# Patient Record
Sex: Male | Born: 1960 | Race: White | Hispanic: No | Marital: Married | Smoking: Current some day smoker
Health system: Southern US, Community
[De-identification: ages and names within clinical notes are randomized; demographics above are authoritative.]

## PROBLEM LIST (undated history)

## (undated) HISTORY — PX: WISDOM TOOTH EXTRACTION: SHX21

---
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
## 2002-07-22 ENCOUNTER — Encounter: Payer: Self-pay | Admitting: Internal Medicine

## 2002-07-22 ENCOUNTER — Encounter: Admission: RE | Admit: 2002-07-22 | Discharge: 2002-07-22 | Payer: Self-pay | Admitting: Internal Medicine

## 2002-08-25 ENCOUNTER — Ambulatory Visit (HOSPITAL_COMMUNITY): Admission: RE | Admit: 2002-08-25 | Discharge: 2002-08-25 | Payer: Self-pay | Admitting: Neurosurgery

## 2002-08-25 ENCOUNTER — Encounter: Payer: Self-pay | Admitting: Neurosurgery

## 2005-01-03 ENCOUNTER — Ambulatory Visit: Payer: Self-pay | Admitting: Internal Medicine

## 2005-01-04 ENCOUNTER — Ambulatory Visit: Payer: Self-pay | Admitting: Internal Medicine

## 2005-01-31 ENCOUNTER — Emergency Department (HOSPITAL_COMMUNITY): Admission: EM | Admit: 2005-01-31 | Discharge: 2005-01-31 | Payer: Self-pay | Admitting: Emergency Medicine

## 2006-07-03 ENCOUNTER — Encounter: Admission: RE | Admit: 2006-07-03 | Discharge: 2006-07-03 | Payer: Self-pay | Admitting: Internal Medicine

## 2006-12-14 ENCOUNTER — Encounter: Admission: RE | Admit: 2006-12-14 | Discharge: 2006-12-14 | Payer: Self-pay | Admitting: Internal Medicine

## 2008-12-16 IMAGING — US US ABDOMEN COMPLETE
1 series · 14 of 25 positions shown · non-contrast
Comparison: none

CLINICAL DATA: Liver lesion by CT. 
 ABDOMEN ULTRASOUND:
TECHNIQUE: Complete abdominal ultrasound examination was performed including evaluation of the liver, gallbladder, bile ducts, pancreas, kidneys, spleen, IVC, and abdominal aorta.

[Series 1: us abdomen complete · 0.29mm/px · 14 of 77 slices shown]
[im 1/77]
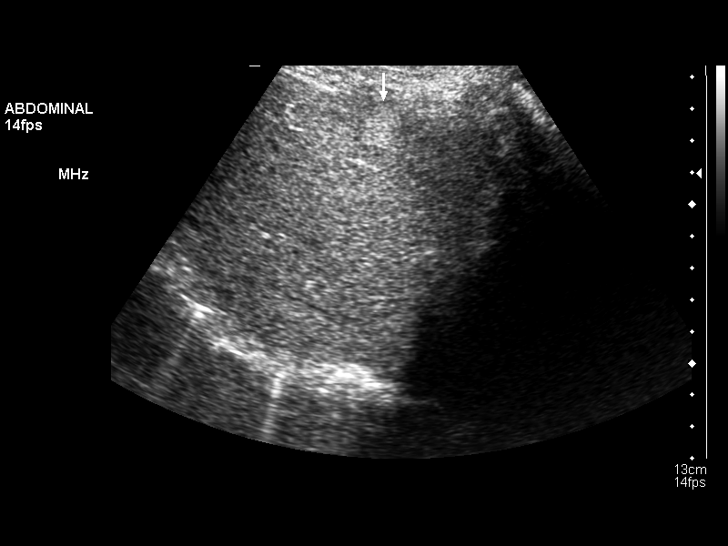
[im 7/77]
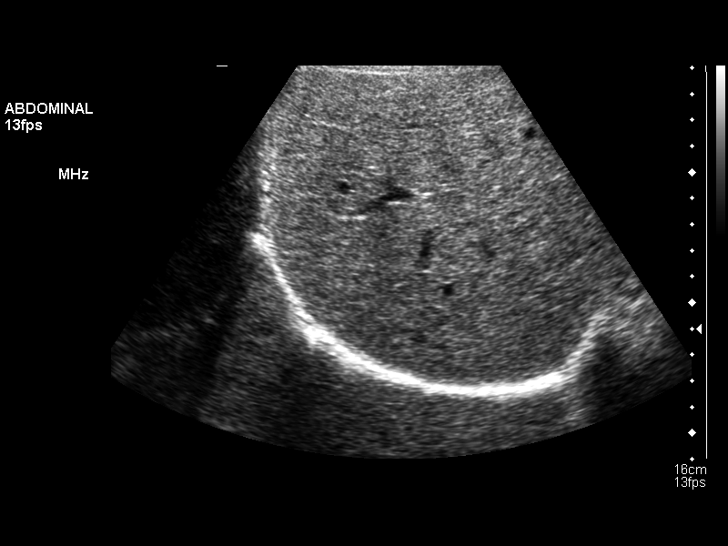
[im 13/77]
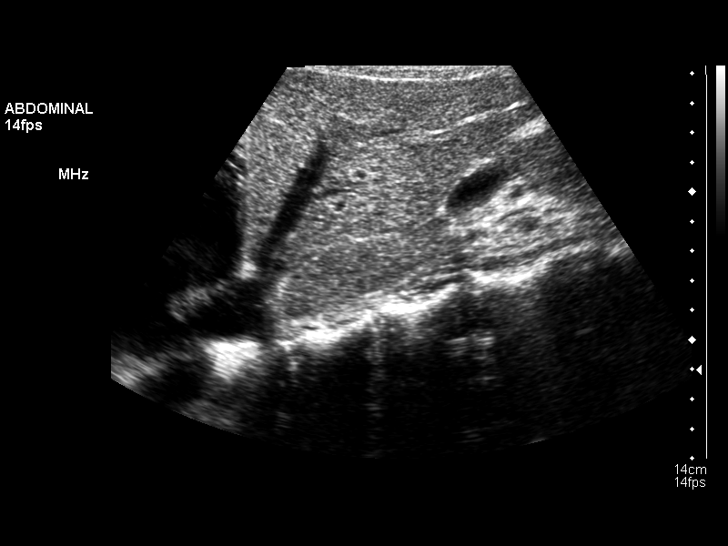
[im 20/77]
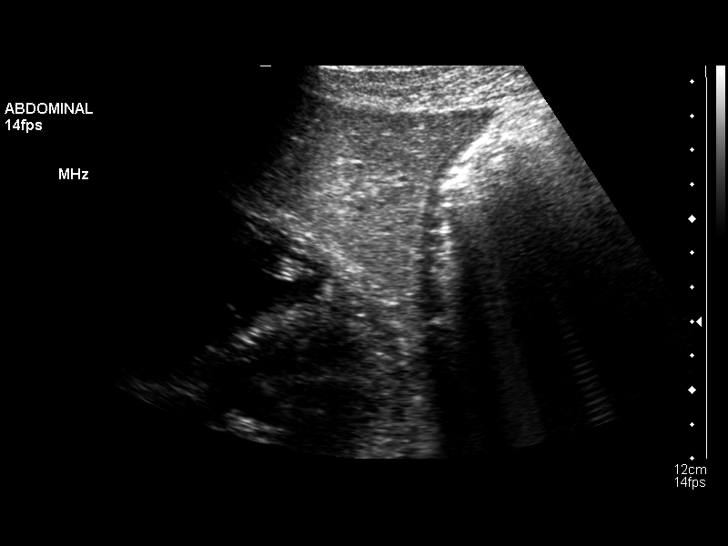
[im 26/77]
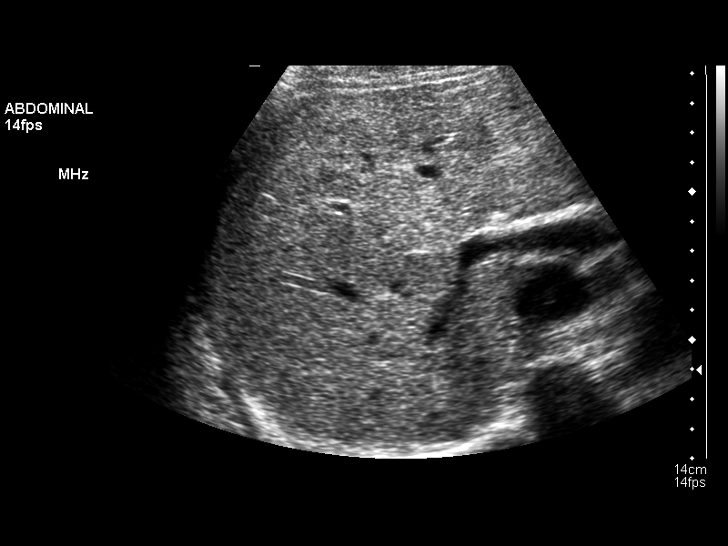
[im 29/77]
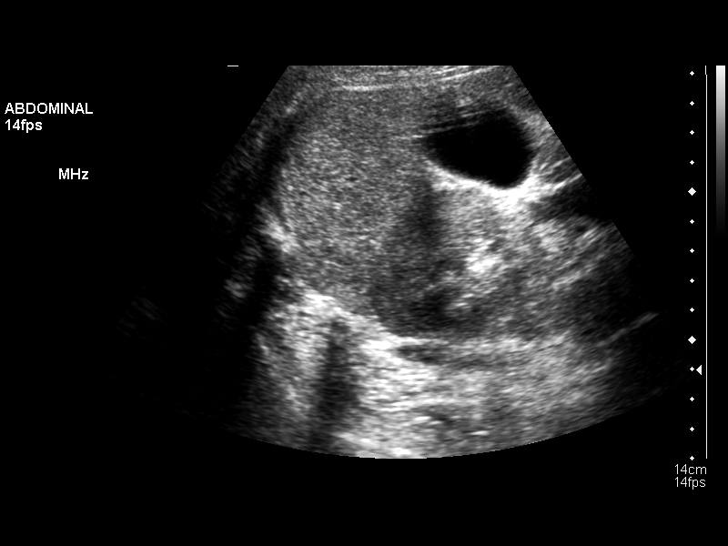
[im 35/77]
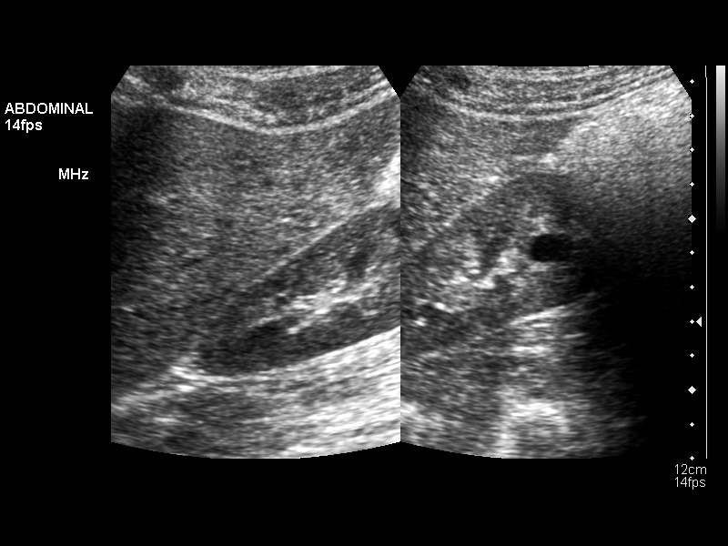
[im 42/77]
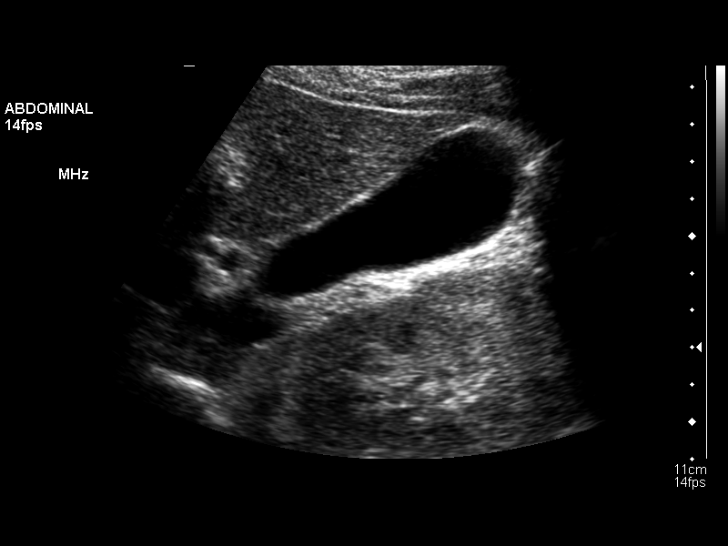
[im 48/77]
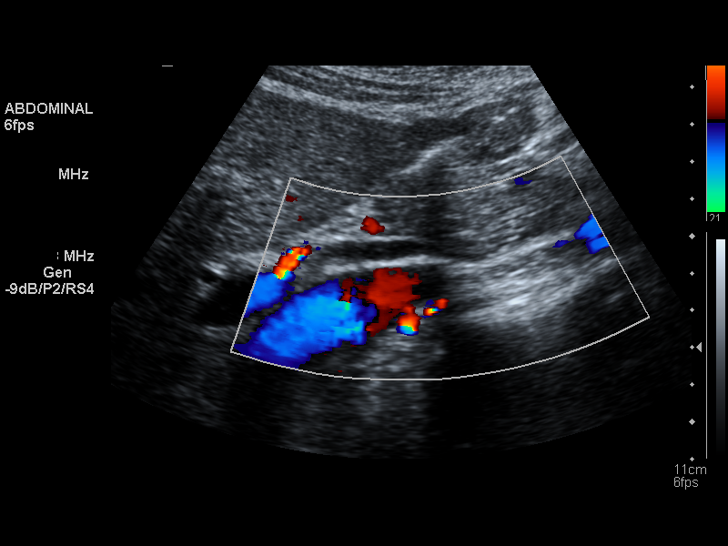
[im 51/77]
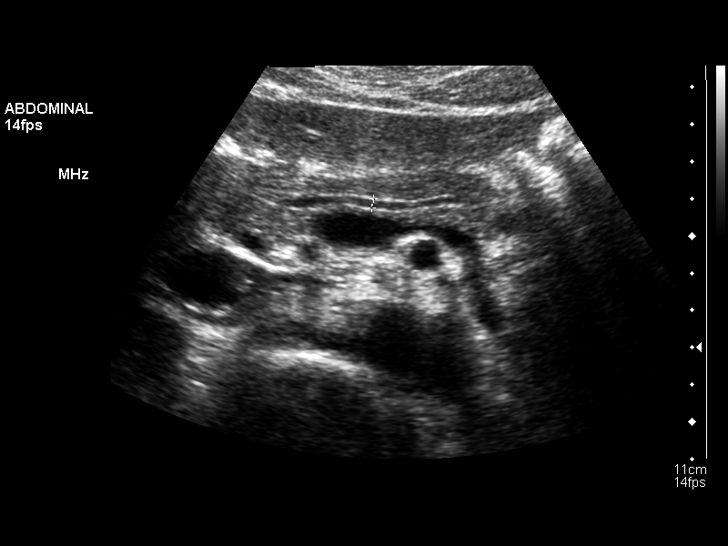
[im 58/77]
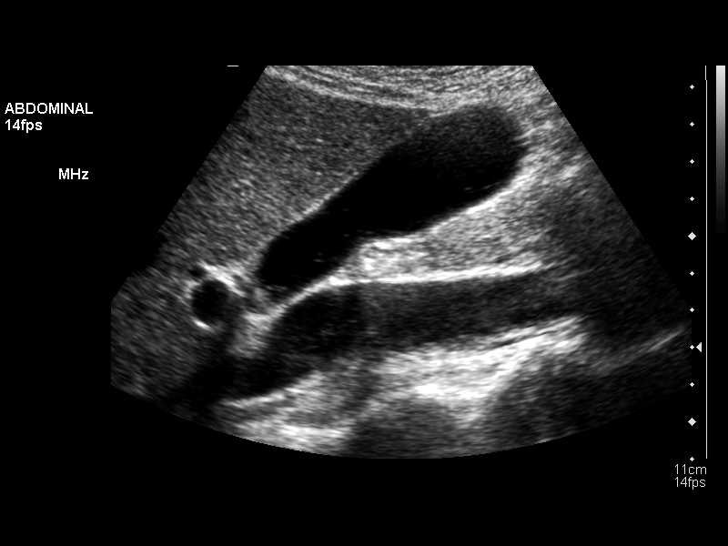
[im 64/77]
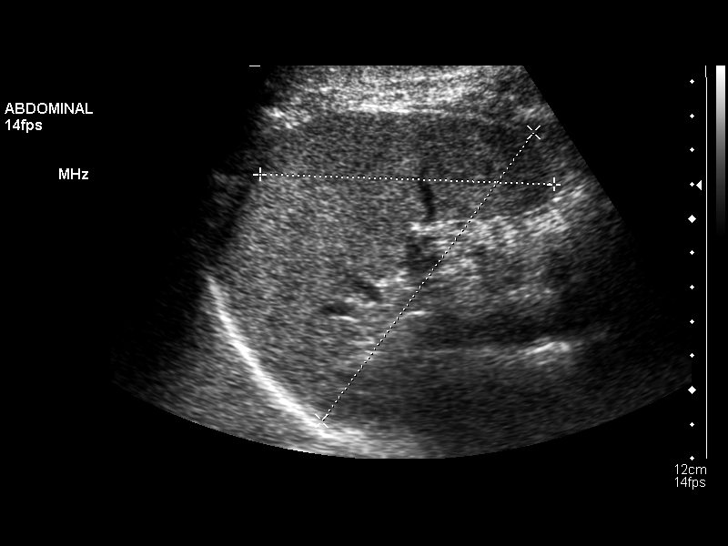
[im 70/77]
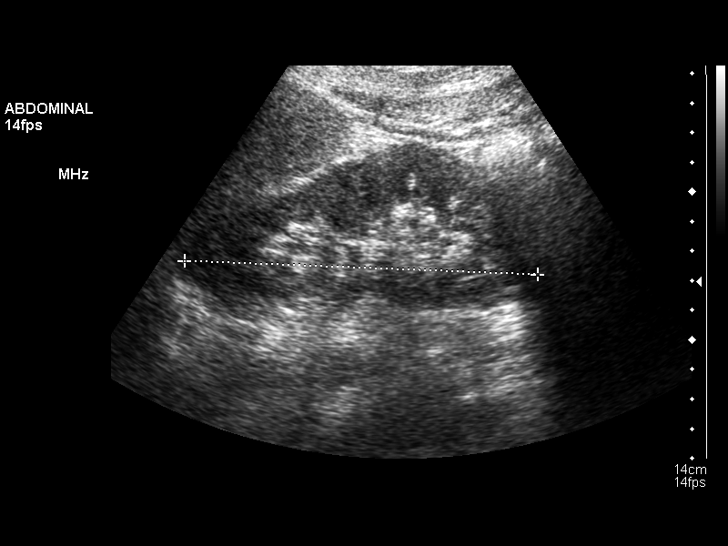
[im 77/77]
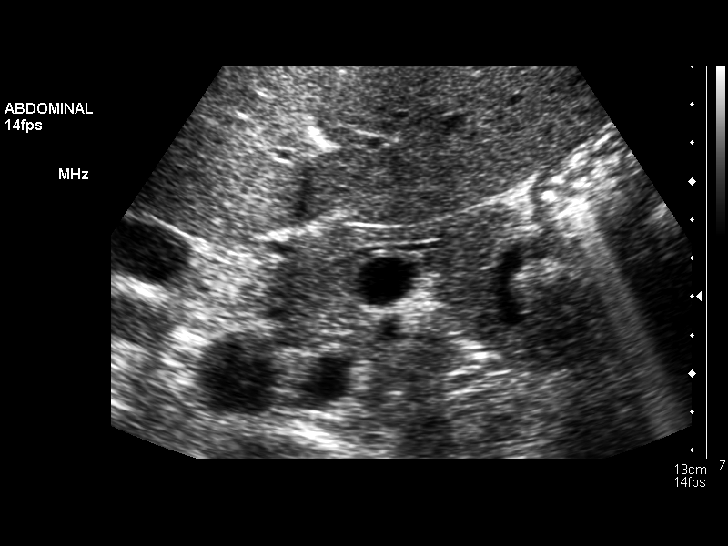

[14 of 25 positions shown; findings below may reference images not displayed]

FINDINGS: The gallbladder is well seen and no gallstones are noted.  The liver has a normal echogenic pattern with the exception of a single focus of increased echogenicity in the periphery of the right lobe corresponding to the low attenuation noted on CT, consistent with a hemangioma of 1.4 x 1.0 x 1.1 cm.  Common bile duct is normal measuring 3.2 mm in diameter.  The IVC, pancreas and spleen appear normal although the pancreatic duct is within upper limits of normal at 2.5 mm in diameter.  No hydronephrosis is seen.  The right kidney measures 10.6 cm sagittally with the left kidney measuring 11.9 cm.  A 1.1 cm cyst is noted in the mid portion of the right kidney.  The abdominal aorta is normal in caliber.
IMPRESSION: 1.  No gallstones. 
 2.  1.4 cm right lobe of liver hemangioma.

## 2013-06-24 ENCOUNTER — Encounter: Payer: Self-pay | Admitting: Gastroenterology

## 2013-07-30 ENCOUNTER — Ambulatory Visit (AMBULATORY_SURGERY_CENTER): Payer: Self-pay | Admitting: *Deleted

## 2013-07-30 VITALS — Ht 72.0 in | Wt 174.8 lb

## 2013-07-30 DIAGNOSIS — Z1211 Encounter for screening for malignant neoplasm of colon: Secondary | ICD-10-CM

## 2013-07-30 MED ORDER — MOVIPREP 100 G PO SOLR: ORAL | Status: DC

## 2013-07-30 NOTE — Progress Notes (Signed)
No allergies to eggs or soy. No problems with anesthesia.  

## 2013-07-31 ENCOUNTER — Encounter: Payer: Self-pay | Admitting: Gastroenterology

## 2013-08-12 ENCOUNTER — Ambulatory Visit (AMBULATORY_SURGERY_CENTER): Payer: BC Managed Care – PPO | Admitting: Gastroenterology

## 2013-08-12 ENCOUNTER — Encounter: Payer: Self-pay | Admitting: Gastroenterology

## 2013-08-12 VITALS — BP 100/71 | HR 74 | Temp 96.3°F | Resp 11 | Ht 72.0 in | Wt 174.0 lb

## 2013-08-12 DIAGNOSIS — Z1211 Encounter for screening for malignant neoplasm of colon: Secondary | ICD-10-CM

## 2013-08-12 DIAGNOSIS — D126 Benign neoplasm of colon, unspecified: Secondary | ICD-10-CM

## 2013-08-12 MED ORDER — SODIUM CHLORIDE 0.9 % IV SOLN: 500.0000 mL | INTRAVENOUS | Status: DC

## 2013-08-12 NOTE — Op Note (Signed)
[facility]  [address] [place], [address]   COLONOSCOPY PROCEDURE REPORT  PATIENT: Darryl Lozano, Darryl Lozano  MR#: [id number] BIRTHDATE: [birth year] , 52  yrs. old GENDER: Male ENDOSCOPIST: Milus Banister, MD REFERRED SH:FWYOVZC Minna Antis, M.D. PROCEDURE DATE:  08/12/2013 PROCEDURE:   Colonoscopy with snare polypectomy First Screening Colonoscopy - Avg.  risk and is 50 yrs.  old or older Yes.  Prior Negative Screening - Now for repeat screening. N/A  History of Adenoma - Now for follow-up colonoscopy & has been > or = to 3 yrs.  N/A  Polyps Removed Today? Yes. ASA CLASS:   Class I INDICATIONS:average risk screening. MEDICATIONS: propofol (Diprivan) 250mg  IV and MAC sedation, administered by CRNA  DESCRIPTION OF PROCEDURE:   After the risks benefits and alternatives of the procedure were thoroughly explained, informed consent was obtained.  A digital rectal exam revealed no abnormalities of the rectum.   The LB [id number]  endoscope was introduced through the anus and advanced to the cecum, which was identified by both the appendix and ileocecal valve. No adverse events experienced.   The quality of the prep was excellent.  The instrument was then slowly withdrawn as the colon was fully examined.   COLON FINDINGS: One polyp was found, removed and sent to pathology. This was sessile, located in ascending segment, 76mm across, removed with cold snare.  The examination was otherwise normal. Retroflexed views revealed no abnormalities. The time to cecum=6 minutes 23 seconds.  Withdrawal time=8 minutes 36 seconds.  The scope was withdrawn and the procedure completed. COMPLICATIONS: There were no complications.  ENDOSCOPIC IMPRESSION: One polyp was found, removed and sent to pathology. The examination was otherwise normal.  RECOMMENDATIONS: If the polyp(s) removed today are proven to be adenomatous (pre-cancerous) polyps, you will need a repeat colonoscopy in  5 years.  Otherwise you should continue to follow colorectal cancer screening guidelines for "routine risk" patients with colonoscopy in 10 years.  You will receive a letter within 1-2 weeks with the results of your biopsy as well as final recommendations.  Please call my office if you have not received a letter after 3 weeks.   eSigned:  Milus Banister, MD 08/12/2013 8:24 AM

## 2013-08-12 NOTE — Progress Notes (Signed)
Called to room to assist during endoscopic procedure.  Patient ID and intended procedure confirmed with present staff. Received instructions for my participation in the procedure from the performing physician.  

## 2013-08-12 NOTE — Patient Instructions (Signed)
YOU HAD AN ENDOSCOPIC PROCEDURE TODAY AT THE [facility]: Refer to the procedure report that was given to you for any specific questions about what was found during the examination.  If the procedure report does not answer your questions, please call your gastroenterologist to clarify.  If you requested that your care partner not be given the details of your procedure findings, then the procedure report has been included in a sealed envelope for you to review at your convenience later.  YOU SHOULD EXPECT: Some feelings of bloating in the abdomen. Passage of more gas than usual.  Walking can help get rid of the air that was put into your GI tract during the procedure and reduce the bloating. If you had a lower endoscopy (such as a colonoscopy or flexible sigmoidoscopy) you may notice spotting of blood in your stool or on the toilet paper. If you underwent a bowel prep for your procedure, then you may not have a normal bowel movement for a few days.  DIET: Your first meal following the procedure should be a light meal and then it is ok to progress to your normal diet.  A half-sandwich or bowl of soup is an example of a good first meal.  Heavy or fried foods are harder to digest and may make you feel nauseous or bloated.  Likewise meals heavy in dairy and vegetables can cause extra gas to form and this can also increase the bloating.  Drink plenty of fluids but you should avoid alcoholic beverages for 24 hours.  ACTIVITY: Your care partner should take you home directly after the procedure.  You should plan to take it easy, moving slowly for the rest of the day.  You can resume normal activity the day after the procedure however you should NOT DRIVE or use heavy machinery for 24 hours (because of the sedation medicines used during the test).    SYMPTOMS TO REPORT IMMEDIATELY: A gastroenterologist can be reached at any hour.  During normal business hours, 8:30 AM to 5:00 PM Monday through Friday,  call [phone].  After hours and on weekends, please call the GI answering service at [phone] who will take a message and have the physician on call contact you.   Following lower endoscopy (colonoscopy or flexible sigmoidoscopy):  Excessive amounts of blood in the stool  Significant tenderness or worsening of abdominal pains  Swelling of the abdomen that is new, acute  Fever of 100F or higher    FOLLOW UP: If any biopsies were taken you will be contacted by phone or by letter within the next 1-3 weeks.  Call your gastroenterologist if you have not heard about the biopsies in 3 weeks.  Our staff will call the home number listed on your records the next business day following your procedure to check on you and address any questions or concerns that you may have at that time regarding the information given to you following your procedure. This is a courtesy call and so if there is no answer at the home number and we have not heard from you through the emergency physician on call, we will assume that you have returned to your regular daily activities without incident.  SIGNATURES/CONFIDENTIALITY: You and/or your care partner have signed paperwork which will be entered into your electronic medical record.  These signatures attest to the fact that that the information above on your After Visit Summary has been reviewed and is understood.  Full responsibility of the confidentiality   of this discharge information lies with you and/or your care-partner.     

## 2013-08-12 NOTE — Progress Notes (Signed)
A/ox3 pleased with MAC, report to Karen RN 

## 2013-08-13 ENCOUNTER — Telehealth: Payer: Self-pay

## 2013-08-13 NOTE — Telephone Encounter (Signed)
Left message on answering machine. 

## 2013-08-20 ENCOUNTER — Encounter: Payer: Self-pay | Admitting: Gastroenterology

## 2020-10-07 ENCOUNTER — Ambulatory Visit: Payer: Self-pay | Admitting: Dermatology

## 2022-01-23 DIAGNOSIS — L723 Sebaceous cyst: Secondary | ICD-10-CM | POA: Diagnosis not present

## 2022-01-23 DIAGNOSIS — R2232 Localized swelling, mass and lump, left upper limb: Secondary | ICD-10-CM | POA: Diagnosis not present

## 2022-01-23 DIAGNOSIS — L918 Other hypertrophic disorders of the skin: Secondary | ICD-10-CM | POA: Diagnosis not present

## 2022-01-23 DIAGNOSIS — L570 Actinic keratosis: Secondary | ICD-10-CM | POA: Diagnosis not present

## 2022-02-20 DIAGNOSIS — Z125 Encounter for screening for malignant neoplasm of prostate: Secondary | ICD-10-CM | POA: Diagnosis not present

## 2022-02-20 DIAGNOSIS — Z0000 Encounter for general adult medical examination without abnormal findings: Secondary | ICD-10-CM | POA: Diagnosis not present

## 2022-02-27 DIAGNOSIS — Z0000 Encounter for general adult medical examination without abnormal findings: Secondary | ICD-10-CM | POA: Diagnosis not present

## 2022-02-27 DIAGNOSIS — Z23 Encounter for immunization: Secondary | ICD-10-CM | POA: Diagnosis not present

## 2022-05-31 DIAGNOSIS — Z23 Encounter for immunization: Secondary | ICD-10-CM | POA: Diagnosis not present

## 2022-08-09 DIAGNOSIS — L820 Inflamed seborrheic keratosis: Secondary | ICD-10-CM | POA: Diagnosis not present

## 2022-08-09 DIAGNOSIS — Z1283 Encounter for screening for malignant neoplasm of skin: Secondary | ICD-10-CM | POA: Diagnosis not present

## 2022-08-09 DIAGNOSIS — D225 Melanocytic nevi of trunk: Secondary | ICD-10-CM | POA: Diagnosis not present

## 2022-08-09 DIAGNOSIS — L821 Other seborrheic keratosis: Secondary | ICD-10-CM | POA: Diagnosis not present

## 2023-03-12 DIAGNOSIS — Z125 Encounter for screening for malignant neoplasm of prostate: Secondary | ICD-10-CM | POA: Diagnosis not present

## 2023-03-12 DIAGNOSIS — E781 Pure hyperglyceridemia: Secondary | ICD-10-CM | POA: Diagnosis not present

## 2023-03-12 DIAGNOSIS — Z0000 Encounter for general adult medical examination without abnormal findings: Secondary | ICD-10-CM | POA: Diagnosis not present

## 2023-03-19 DIAGNOSIS — K644 Residual hemorrhoidal skin tags: Secondary | ICD-10-CM | POA: Diagnosis not present

## 2023-03-19 DIAGNOSIS — R3914 Feeling of incomplete bladder emptying: Secondary | ICD-10-CM | POA: Diagnosis not present

## 2023-03-19 DIAGNOSIS — N401 Enlarged prostate with lower urinary tract symptoms: Secondary | ICD-10-CM | POA: Diagnosis not present

## 2023-03-19 DIAGNOSIS — Z23 Encounter for immunization: Secondary | ICD-10-CM | POA: Diagnosis not present

## 2023-03-19 DIAGNOSIS — Z0000 Encounter for general adult medical examination without abnormal findings: Secondary | ICD-10-CM | POA: Diagnosis not present

## 2023-03-19 DIAGNOSIS — E781 Pure hyperglyceridemia: Secondary | ICD-10-CM | POA: Diagnosis not present

## 2023-06-11 NOTE — Progress Notes (Signed)
 Chief Complaint: bump on rectum Primary GI Doctor: (Previously Dr. Teressa) Dr. Federico  HPI: Patient is a 63 year old male patient, previously known to Dr. Teressa, that presents as a new patient with past medical history of hernia (1972) and benign colonic polyps (2015) for a complaint of hemorrhoids.     Patient had colonoscopy with Dr. Teressa on 08/12/2013 and 1 benign polyp was found, the examination was otherwise normal. Recommendations to follow-up in 10 years. He reports he moved to [place]  for a few years and had colonoscopy in 2023 that per patient was normal.  Interval History:    Patient presents with main complaint of feeling bump outside anus, not sure if hemorrhoids. He has concerns with what it is and having it in his rectal area due to cleanliness. Patient reports no issues with constipation or diarrhea. No straining or sitting on toilet long periods of time. No rectal itching, pain, or bleeding. He would like to have growth removed and comes in for evaluation. No other gastrointestinal complaints at this time.  History reviewed. No pertinent past medical history.  Past Surgical History:  Procedure Laterality Date   INGUINAL HERNIA REPAIR Right 1973   No current outpatient medications on file.   No current facility-administered medications for this visit.   Allergies as of 06/12/2023   (Not on File)   Family History  Problem Relation Age of Onset   Esophageal cancer Maternal Grandmother    Colon cancer Neg Hx    Stomach cancer Neg Hx    Review of Systems:    Constitutional: No weight loss, fever, chills, weakness or fatigue HEENT: Eyes: No change in vision               Ears, Nose, Throat:  No change in hearing or congestion Skin: No rash or itching Cardiovascular: No chest pain, chest pressure or palpitations   Respiratory: No SOB or cough Gastrointestinal: See HPI and otherwise negative Genitourinary: No dysuria or change in urinary  frequency Neurological: No headache, dizziness or syncope Musculoskeletal: No new muscle or joint pain Hematologic: No bleeding or bruising Psychiatric: No history of depression or anxiety   Physical Exam:  Vital signs: BP 110/70   Pulse 65   Ht 6' (1.829 m)   Wt 169 lb (76.7 kg)   BMI 22.92 kg/m   Constitutional:  Pleasant Caucasian male appears to be in NAD, Well developed, Well nourished, alert and cooperative Respiratory: Respirations even and unlabored. Lungs clear to auscultation bilaterally.   No wheezes, crackles, or rhonchi.  Cardiovascular: Normal S1, S2. No MRG. Regular rate and rhythm. No peripheral edema, cyanosis or pallor.  Gastrointestinal:  Soft, nondistended, nontender. No rebound or guarding. Normal bowel sounds. No appreciable masses or hepatomegaly. Rectal: external rectal exam with small skin tag and dime sized lesion that is hard and no discharge or pain with pressure,noted white head nodules around rectum, normal rectal tone, no internal hemorrhoids appreciated, no masses, non tender, brown stool Skin:   Dry and intact without significant lesions or rashes. Psychiatric: Oriented to person, place and time. Demonstrates good judgement and reason without abnormal affect or behaviors.  RELEVANT LABS AND IMAGING: NO LABS  08/12/2013 colonoscopy with Dr. Teressa Impression 1 polyp was found, removed and sent to pathology The examination was otherwise normal.  Recommendations to follow-up in 10 years Path: Diagnosis Surgical [P], ascending, polyp - BENIGN LYMPHOID POLYP. - NO ADENOMATOUS CHANGE OR MALIGNANCY IDENTIFIED  2023 [facility]oscopy (in [place]  [facility] per patient normal.   Encounter Diagnoses  Name Primary?   Anal lesion Yes   History of colonic polyps   63 year old year old male patient that presents with main concern of external growth on rectum. He denies any rectal pain, itching, or bleeding but requests it be  removed. DRE exam normal otherwise. UTD on colon screening. Will refer for surgical consult.  Plan: -Request records of colonoscopy from 2023 in [place]  for records, pending recall on results -Refer to [facility] for consultation of external rectal growth/tags  Darryl Ruggerio, [name] 06/12/2023, 3:21 PM  Cc: Darryl Ade, MD

## 2023-06-12 ENCOUNTER — Ambulatory Visit: Payer: Self-pay | Admitting: Gastroenterology

## 2023-06-12 ENCOUNTER — Encounter: Payer: Self-pay | Admitting: Gastroenterology

## 2023-06-12 VITALS — BP 110/70 | HR 65 | Ht 72.0 in | Wt 169.0 lb

## 2023-06-12 DIAGNOSIS — K629 Disease of anus and rectum, unspecified: Secondary | ICD-10-CM

## 2023-06-12 DIAGNOSIS — Z860100 Personal history of colon polyps, unspecified: Secondary | ICD-10-CM

## 2023-06-12 NOTE — Progress Notes (Signed)
 I agree with the assessment and plan as outlined by Ms. May.

## 2023-06-12 NOTE — Patient Instructions (Signed)
 We will refer you to [facility] and they will contact you with an appointment once records are reviewed   _______________________________________________________  If your blood pressure at your visit was 140/90 or greater, please contact your primary care physician to follow up on this.  _______________________________________________________  If you are age 63 or older, your body mass index should be between 23-30. Your Body mass index is 22.92 kg/m. If this is out of the aforementioned range listed, please consider follow up with your Primary Care Provider.  If you are age 35 or younger, your body mass index should be between 19-25. Your Body mass index is 22.92 kg/m. If this is out of the aformentioned range listed, please consider follow up with your Primary Care Provider.   ________________________________________________________  The [facility] providers would like to encourage you to use MYCHART to communicate with providers for non-urgent requests or questions.  Due to long hold times on the telephone, sending your provider a message by [facility] may be a faster and more efficient way to get a response.  Please allow 48 business hours for a response.  Please remember that this is for non-urgent requests.  _______________________________________________________   I appreciate the  opportunity to care for you  Thank You   Deanna May,NP

## 2023-06-20 DIAGNOSIS — L728 Other follicular cysts of the skin and subcutaneous tissue: Secondary | ICD-10-CM | POA: Diagnosis not present

## 2023-06-20 DIAGNOSIS — L821 Other seborrheic keratosis: Secondary | ICD-10-CM | POA: Diagnosis not present

## 2023-06-20 DIAGNOSIS — L308 Other specified dermatitis: Secondary | ICD-10-CM | POA: Diagnosis not present

## 2023-06-20 DIAGNOSIS — Z1283 Encounter for screening for malignant neoplasm of skin: Secondary | ICD-10-CM | POA: Diagnosis not present

## 2023-06-26 ENCOUNTER — Telehealth: Payer: Self-pay | Admitting: Gastroenterology

## 2023-06-26 NOTE — Telephone Encounter (Signed)
Patient called and stated that he was suppose to get a referral sent over to [facility]. Patient is requesting a call back. Please advise.

## 2023-06-26 NOTE — Telephone Encounter (Signed)
Referral was submitted on 06/10/2023 , Referral in system as ready for scheduling with Romie Levee I will call [facility] to see how much longer it will be  Dx  Anal lesion  I called [facility] and they pulled his referral and will call him to schedule.   I called the patient to inform

## 2023-07-30 ENCOUNTER — Ambulatory Visit: Payer: Self-pay | Admitting: General Surgery

## 2023-07-30 DIAGNOSIS — L700 Acne vulgaris: Secondary | ICD-10-CM | POA: Diagnosis not present

## 2023-07-30 DIAGNOSIS — A630 Anogenital (venereal) warts: Secondary | ICD-10-CM | POA: Diagnosis not present

## 2023-07-30 NOTE — H&P (View-Only) (Signed)
   REFERRING PHYSICIAN:  May, Deanna J  PROVIDER:  Elenora Gamma, MD  MRN: [id number] DOB: 1961/01/22 DATE OF ENCOUNTER: 07/30/2023  Subjective   Chief Complaint: No chief complaint on file.     History of Present Illness: Darryl Lozano is a 63 y.o. male who is seen today as an office consultation at the request of Dr. May for evaluation of No chief complaint on file. .  Pt with external perianal growth.  He was sent here for further examination.     Review of Systems: A complete review of systems was obtained from the patient.  I have reviewed this information and discussed as appropriate with the patient.  See HPI as well for other ROS.     Medical History: History reviewed. No pertinent past medical history.  There is no problem list on file for this patient.   Past Surgical History:  Procedure Laterality Date   HERNIA REPAIR       No Known Allergies  No current outpatient medications on file prior to visit.   No current facility-administered medications on file prior to visit.    Family History  Problem Relation Age of Onset   Stroke Father    Coronary Artery Disease (Blocked arteries around heart) Father    Breast cancer Sister      Social History   Tobacco Use  Smoking Status Never  Smokeless Tobacco Never     Social History   Socioeconomic History   Marital status: Married  Tobacco Use   Smoking status: Never   Smokeless tobacco: Never  Vaping Use   Vaping status: Never Used  Substance and Sexual Activity   Alcohol use: Yes   Drug use: Never   Social Drivers of Health   Housing Stability: Unknown (07/30/2023)   Housing Stability Vital Sign    Homeless in the Last Year: No    Objective:    Vitals:   07/30/23 1416  BP: 114/62  Pulse: 60  Temp: 36.7 C (98 F)  SpO2: 98%  Weight: 76.3 kg (168 lb 3.2 oz)  Height: 182.9 cm (6')  PainSc: 0-No pain     Exam Gen: NAD Abd: soft Rectal: comedomes of the perianal area.   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  I recommended an EUA with laser ablation and internal examination Closed comedone These are benign.  Will take care of these while in the OR    Vanita Panda, MD Colon and Rectal Surgery [facility]

## 2023-07-30 NOTE — H&P (Signed)
   REFERRING PHYSICIAN:  May, Deanna J  PROVIDER:  Elenora Gamma, MD  MRN: [id number] DOB: 1961/01/22 DATE OF ENCOUNTER: 07/30/2023  Subjective   Chief Complaint: No chief complaint on file.     History of Present Illness: Darryl Lozano is a 63 y.o. male who is seen today as an office consultation at the request of Dr. May for evaluation of No chief complaint on file. .  Pt with external perianal growth.  He was sent here for further examination.     Review of Systems: A complete review of systems was obtained from the patient.  I have reviewed this information and discussed as appropriate with the patient.  See HPI as well for other ROS.     Medical History: History reviewed. No pertinent past medical history.  There is no problem list on file for this patient.   Past Surgical History:  Procedure Laterality Date   HERNIA REPAIR       No Known Allergies  No current outpatient medications on file prior to visit.   No current facility-administered medications on file prior to visit.    Family History  Problem Relation Age of Onset   Stroke Father    Coronary Artery Disease (Blocked arteries around heart) Father    Breast cancer Sister      Social History   Tobacco Use  Smoking Status Never  Smokeless Tobacco Never     Social History   Socioeconomic History   Marital status: Married  Tobacco Use   Smoking status: Never   Smokeless tobacco: Never  Vaping Use   Vaping status: Never Used  Substance and Sexual Activity   Alcohol use: Yes   Drug use: Never   Social Drivers of Health   Housing Stability: Unknown (07/30/2023)   Housing Stability Vital Sign    Homeless in the Last Year: No    Objective:    Vitals:   07/30/23 1416  BP: 114/62  Pulse: 60  Temp: 36.7 C (98 F)  SpO2: 98%  Weight: 76.3 kg (168 lb 3.2 oz)  Height: 182.9 cm (6')  PainSc: 0-No pain     Exam Gen: NAD Abd: soft Rectal: comedomes of the perianal area.   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  I recommended an EUA with laser ablation and internal examination Closed comedone These are benign.  Will take care of these while in the OR    Vanita Panda, MD Colon and Rectal Surgery [facility]

## 2023-08-06 NOTE — Progress Notes (Signed)
 COVID Vaccine Completed:  Date of COVID positive in last 90 days:  PCP - Georgianne Fick, MD Cardiologist -   Chest x-ray -  EKG -  Stress Test -  ECHO -  Cardiac Cath -  Pacemaker/ICD device last checked: Spinal Cord Stimulator:  Bowel Prep -   Sleep Study -  CPAP -   Fasting Blood Sugar -  Checks Blood Sugar _____ times a day  Last dose of GLP1 agonist-  N/A GLP1 instructions:  Hold 7 days before surgery    Last dose of SGLT-2 inhibitors-  N/A SGLT-2 instructions:  Hold 3 days before surgery    Blood Thinner Instructions:  Last dose:   Time: Aspirin Instructions: Last Dose:  Activity level:  Can go up a flight of stairs and perform activities of daily living without stopping and without symptoms of chest pain or shortness of breath.  Able to exercise without symptoms  Unable to go up a flight of stairs without symptoms of     Anesthesia review:   Patient denies shortness of breath, fever, cough and chest pain at PAT appointment  Patient verbalized understanding of instructions that were given to them at the PAT appointment. Patient was also instructed that they will need to review over the PAT instructions again at home before surgery.

## 2023-08-06 NOTE — Patient Instructions (Signed)
 SURGICAL WAITING ROOM VISITATION Patients having surgery or a procedure may have no more than 2 support people in the waiting area - these visitors may rotate.    Children under the age of 49 must have an adult with them who is not the patient.  Due to an increase in RSV and influenza rates and associated hospitalizations, children ages 63 and under may not visit patients in [facility] hospitals.   If the patient needs to stay at the hospital during part of their recovery, the visitor guidelines for inpatient rooms apply. Pre-op nurse will coordinate an appropriate time for 1 support person to accompany patient in pre-op.  This support person may not rotate.    Please refer to the [facility] website for the visitor guidelines for Inpatients (after your surgery is over and you are in a regular room).       Your procedure is scheduled on: 08-08-23   Report to [facility] Main Entrance    Report to admitting at 7:45 AM   Call this number if you have problems the morning of surgery [phone]   Do not eat food or drink liquids :After Midnight.          If you have questions, please contact your surgeon's office.   FOLLOW BOWEL PREP AND ANY ADDITIONAL PRE OP INSTRUCTIONS YOU RECEIVED FROM YOUR SURGEON'S OFFICE!!!     Oral Hygiene is also important to reduce your risk of infection.                                    Remember - BRUSH YOUR TEETH THE MORNING OF SURGERY WITH YOUR REGULAR TOOTHPASTE   Do NOT smoke after Midnight   Take these medicines the morning of surgery with A SIP OF WATER:    None  Stop all vitamins and herbal supplements 7 days before surgery  Bring CPAP mask and tubing day of surgery.                              You may not have any metal on your body including  jewelry, and body piercing             Do not wear  lotions, powders, cologne, or deodorant              Men may shave face and neck.   Do not bring valuables to the hospital. [facility] IS NOT RESPONSIBLE   FOR VALUABLES.   Contacts, dentures or bridgework may not be worn into surgery.  DO NOT BRING YOUR HOME MEDICATIONS TO THE HOSPITAL. PHARMACY WILL DISPENSE MEDICATIONS LISTED ON YOUR MEDICATION LIST TO YOU DURING YOUR ADMISSION IN THE HOSPITAL!    Patients discharged on the day of surgery will not be allowed to drive home.  Someone NEEDS to stay with you for the first 24 hours after anesthesia.   Special Instructions: Bring a copy of your healthcare power of attorney and living will documents the day of surgery if you haven't scanned them before.              Please read over the following fact sheets you were given: IF YOU HAVE QUESTIONS ABOUT YOUR PRE-OP INSTRUCTIONS PLEASE CALL [phone]   If you received a COVID test during your pre-op visit  it is requested that you wear a mask when  out in public, stay away from anyone that may not be feeling well and notify your surgeon if you develop symptoms. If you test positive for Covid or have been in contact with anyone that has tested positive in the last 10 days please notify you surgeon.  [place] - Preparing for Surgery Before surgery, you can play an important role.  Because skin is not sterile, your skin needs to be as free of germs as possible.  You can reduce the number of germs on your skin by washing with CHG (chlorahexidine gluconate) soap before surgery.  CHG is an antiseptic cleaner which kills germs and bonds with the skin to continue killing germs even after washing. Please DO NOT use if you have an allergy to CHG or antibacterial soaps.  If your skin becomes reddened/irritated stop using the CHG and inform your nurse when you arrive at Short Stay. Do not shave (including legs and underarms) for at least 48 hours prior to the first CHG shower.  You may shave your face/neck.  Please follow these instructions carefully:  1.  Shower with CHG Soap the night before surgery and the  morning of surgery.  2.   If you choose to wash your hair, wash your hair first as usual with your normal  shampoo.  3.  After you shampoo, rinse your hair and body thoroughly to remove the shampoo.                             4.  Use CHG as you would any other liquid soap.  You can apply chg directly to the skin and wash.  Gently with a scrungie or clean washcloth.  5.  Apply the CHG Soap to your body ONLY FROM THE NECK DOWN.   Do   not use on face/ open                           Wound or open sores. Avoid contact with eyes, ears mouth and   genitals (private parts).                       Wash face,  Genitals (private parts) with your normal soap.             6.  Wash thoroughly, paying special attention to the area where your    surgery  will be performed.  7.  Thoroughly rinse your body with warm water from the neck down.  8.  DO NOT shower/wash with your normal soap after using and rinsing off the CHG Soap.                9.  Pat yourself dry with a clean towel.            10.  Wear clean pajamas.            11.  Place clean sheets on your bed the night of your first shower and do not  sleep with pets. Day of Surgery : Do not apply any lotions/deodorants the morning of surgery.  Please wear clean clothes to the hospital/surgery center.  FAILURE TO FOLLOW THESE INSTRUCTIONS MAY RESULT IN THE CANCELLATION OF YOUR SURGERY  PATIENT SIGNATURE_________________________________  NURSE SIGNATURE__________________________________  ________________________________________________________________________

## 2023-08-07 ENCOUNTER — Encounter (HOSPITAL_COMMUNITY): Payer: Self-pay

## 2023-08-07 ENCOUNTER — Encounter (HOSPITAL_COMMUNITY)
Admission: RE | Admit: 2023-08-07 | Discharge: 2023-08-07 | Disposition: A | Discharge status: Home / Self Care | Payer: Self-pay | Source: Ambulatory Visit | Attending: General Surgery | Admitting: General Surgery

## 2023-08-07 ENCOUNTER — Other Ambulatory Visit: Payer: Self-pay

## 2023-08-07 NOTE — Anesthesia Preprocedure Evaluation (Signed)
 Anesthesia Evaluation  Patient identified by MRN, date of birth, ID band Patient awake    Reviewed: Allergy & Precautions, NPO status , Patient's Chart, lab work & pertinent test results  History of Anesthesia Complications Negative for: history of anesthetic complications  Airway Mallampati: I  TM Distance: >3 FB Neck ROM: Full    Dental  (+) Dental Advisory Given   Pulmonary neg shortness of breath, neg sleep apnea, neg COPD, neg recent URI, Current Smoker (patient denies)   Pulmonary exam normal breath sounds clear to auscultation       Cardiovascular negative cardio ROS  Rhythm:Regular Rate:Normal     Neuro/Psych negative neurological ROS     GI/Hepatic Neg liver ROS,neg GERD  ,,Anal warts   Endo/Other    Renal/GU negative Renal ROS     Musculoskeletal   Abdominal   Peds  Hematology negative hematology ROS (+)   Anesthesia Other Findings   Reproductive/Obstetrics                             Anesthesia Physical Anesthesia Plan  ASA: 2  Anesthesia Plan: MAC   Post-op Pain Management: Tylenol PO (pre-op)*   Induction: Intravenous  PONV Risk Score and Plan: 1 and Propofol infusion, TIVA, Treatment may vary due to age or medical condition and Ondansetron  Airway Management Planned: Natural Airway and Simple Face Mask  Additional Equipment:   Intra-op Plan:   Post-operative Plan:   Informed Consent: I have reviewed the patients History and Physical, chart, labs and discussed the procedure including the risks, benefits and alternatives for the proposed anesthesia with the patient or authorized representative who has indicated his/her understanding and acceptance.       Plan Discussed with: CRNA and Anesthesiologist  Anesthesia Plan Comments: (Discussed with patient risks of MAC including, but not limited to, minor pain or discomfort, hearing people in the room, and  possible need for backup general anesthesia. Risks for general anesthesia also discussed including, but not limited to, sore throat, hoarse voice, chipped/damaged teeth, injury to vocal cords, nausea and vomiting, allergic reactions, lung infection, heart attack, stroke, and death. All questions answered. )        Anesthesia Quick Evaluation

## 2023-08-08 ENCOUNTER — Encounter (HOSPITAL_COMMUNITY)
Admission: RE | Disposition: A | Discharge status: Home / Self Care | Payer: Self-pay | Source: Home / Self Care | Attending: General Surgery

## 2023-08-08 ENCOUNTER — Encounter (HOSPITAL_COMMUNITY): Payer: Self-pay | Admitting: General Surgery

## 2023-08-08 ENCOUNTER — Ambulatory Visit (HOSPITAL_COMMUNITY)
Admission: RE | Admit: 2023-08-08 | Discharge: 2023-08-08 | Disposition: A | Discharge status: Home / Self Care | Payer: Self-pay | Attending: General Surgery | Admitting: General Surgery

## 2023-08-08 ENCOUNTER — Other Ambulatory Visit: Payer: Self-pay

## 2023-08-08 ENCOUNTER — Ambulatory Visit (HOSPITAL_COMMUNITY): Payer: Self-pay | Admitting: Anesthesiology

## 2023-08-08 DIAGNOSIS — F17200 Nicotine dependence, unspecified, uncomplicated: Secondary | ICD-10-CM | POA: Insufficient documentation

## 2023-08-08 DIAGNOSIS — A630 Anogenital (venereal) warts: Secondary | ICD-10-CM | POA: Diagnosis not present

## 2023-08-08 DIAGNOSIS — L700 Acne vulgaris: Secondary | ICD-10-CM | POA: Diagnosis not present

## 2023-08-08 DIAGNOSIS — L918 Other hypertrophic disorders of the skin: Secondary | ICD-10-CM | POA: Diagnosis not present

## 2023-08-08 HISTORY — PX: LASER ABLATION CONDOLAMATA: SHX5941

## 2023-08-08 HISTORY — PX: EXCISION OF SKIN TAG: SHX6270

## 2023-08-08 SURGERY — ABLATION, CONDYLOMA, USING LASER
Anesthesia: Monitor Anesthesia Care

## 2023-08-08 MED ORDER — OXYCODONE HCL 5 MG PO TABS: 5.0000 mg | ORAL_TABLET | ORAL | Status: DC | PRN

## 2023-08-08 MED ORDER — CHLORHEXIDINE GLUCONATE 0.12 % MT SOLN
15.0000 mL | Freq: Once | OROMUCOSAL | Status: AC
  Administered 2023-08-08: 15 mL via OROMUCOSAL

## 2023-08-08 MED ORDER — LIDOCAINE (ANORECTAL) 5 % EX CREA: 1.0000 | TOPICAL_CREAM | Freq: Three times a day (TID) | CUTANEOUS | 1 refills | Status: DC | PRN

## 2023-08-08 MED ORDER — TRAMADOL HCL 50 MG PO TABS: 50.0000 mg | ORAL_TABLET | Freq: Four times a day (QID) | ORAL | 0 refills | Status: DC | PRN

## 2023-08-08 MED ORDER — BUPIVACAINE-EPINEPHRINE (PF) 0.25% -1:200000 IJ SOLN
INTRAMUSCULAR | Status: AC
  Filled 2023-08-08: qty 30

## 2023-08-08 MED ORDER — ACETAMINOPHEN 650 MG RE SUPP: 650.0000 mg | RECTAL | Status: DC | PRN

## 2023-08-08 MED ORDER — ORAL CARE MOUTH RINSE: 15.0000 mL | Freq: Once | OROMUCOSAL | Status: AC

## 2023-08-08 MED ORDER — 0.9 % SODIUM CHLORIDE (POUR BTL) OPTIME
TOPICAL | Status: DC | PRN
  Administered 2023-08-08: 1000 mL

## 2023-08-08 MED ORDER — MIDAZOLAM HCL 2 MG/2ML IJ SOLN
INTRAMUSCULAR | Status: AC
  Filled 2023-08-08: qty 2

## 2023-08-08 MED ORDER — SILVER SULFADIAZINE 1 % EX CREA
1.0000 | TOPICAL_CREAM | Freq: Once | CUTANEOUS | Status: DC
  Filled 2023-08-08: qty 50

## 2023-08-08 MED ORDER — DEXAMETHASONE SODIUM PHOSPHATE 10 MG/ML IJ SOLN
INTRAMUSCULAR | Status: DC | PRN
Start: 2023-08-08 — End: 2023-08-08
  Administered 2023-08-08: 10 mg via INTRAVENOUS

## 2023-08-08 MED ORDER — FENTANYL CITRATE (PF) 100 MCG/2ML IJ SOLN
INTRAMUSCULAR | Status: DC | PRN
  Administered 2023-08-08 (×4): 25 ug via INTRAVENOUS

## 2023-08-08 MED ORDER — FENTANYL CITRATE (PF) 100 MCG/2ML IJ SOLN
INTRAMUSCULAR | Status: AC
  Filled 2023-08-08: qty 2

## 2023-08-08 MED ORDER — BUPIVACAINE LIPOSOME 1.3 % IJ SUSP
INTRAMUSCULAR | Status: AC
  Filled 2023-08-08: qty 20

## 2023-08-08 MED ORDER — DEXMEDETOMIDINE HCL IN NACL 80 MCG/20ML IV SOLN
INTRAVENOUS | Status: DC | PRN
  Administered 2023-08-08 (×4): 4 ug via INTRAVENOUS

## 2023-08-08 MED ORDER — DEXAMETHASONE SODIUM PHOSPHATE 10 MG/ML IJ SOLN
INTRAMUSCULAR | Status: AC
  Filled 2023-08-08: qty 1

## 2023-08-08 MED ORDER — MIDAZOLAM HCL 2 MG/2ML IJ SOLN
INTRAMUSCULAR | Status: DC | PRN
  Administered 2023-08-08: 1 mg via INTRAVENOUS

## 2023-08-08 MED ORDER — EPHEDRINE 5 MG/ML INJ
INTRAVENOUS | Status: AC
  Filled 2023-08-08: qty 5

## 2023-08-08 MED ORDER — STERILE WATER FOR IRRIGATION IR SOLN
Status: DC | PRN
  Administered 2023-08-08: 1000 mL

## 2023-08-08 MED ORDER — ACETIC ACID 5 % SOLN
Status: AC
  Filled 2023-08-08: qty 50

## 2023-08-08 MED ORDER — PROPOFOL 10 MG/ML IV BOLUS
INTRAVENOUS | Status: DC | PRN
  Administered 2023-08-08: 20 mg via INTRAVENOUS
  Administered 2023-08-08: 50 ug/kg/min via INTRAVENOUS
  Administered 2023-08-08: 40 mg via INTRAVENOUS
  Administered 2023-08-08 (×2): 20 mg via INTRAVENOUS

## 2023-08-08 MED ORDER — SODIUM CHLORIDE 0.9% FLUSH: 3.0000 mL | INTRAVENOUS | Status: DC | PRN

## 2023-08-08 MED ORDER — EPHEDRINE SULFATE-NACL 50-0.9 MG/10ML-% IV SOSY
PREFILLED_SYRINGE | INTRAVENOUS | Status: DC | PRN
  Administered 2023-08-08 (×2): 5 mg via INTRAVENOUS

## 2023-08-08 MED ORDER — SODIUM CHLORIDE 0.9 % IV SOLN: 250.0000 mL | INTRAVENOUS | Status: DC | PRN

## 2023-08-08 MED ORDER — ACETAMINOPHEN 500 MG PO TABS
1000.0000 mg | ORAL_TABLET | ORAL | Status: AC
  Administered 2023-08-08: 1000 mg via ORAL
  Filled 2023-08-08: qty 2

## 2023-08-08 MED ORDER — DEXMEDETOMIDINE HCL IN NACL 80 MCG/20ML IV SOLN
INTRAVENOUS | Status: AC
  Filled 2023-08-08: qty 20

## 2023-08-08 MED ORDER — LACTATED RINGERS IV SOLN: INTRAVENOUS | Status: DC

## 2023-08-08 MED ORDER — ONDANSETRON HCL 4 MG/2ML IJ SOLN
INTRAMUSCULAR | Status: DC | PRN
Start: 2023-08-08 — End: 2023-08-08
  Administered 2023-08-08: 4 mg via INTRAVENOUS

## 2023-08-08 MED ORDER — GLYCOPYRROLATE 0.2 MG/ML IJ SOLN
INTRAMUSCULAR | Status: DC | PRN
  Administered 2023-08-08 (×2): .1 mg via INTRAVENOUS

## 2023-08-08 MED ORDER — PROPOFOL 1000 MG/100ML IV EMUL
INTRAVENOUS | Status: AC
  Filled 2023-08-08: qty 100

## 2023-08-08 MED ORDER — ACETAMINOPHEN 325 MG PO TABS: 650.0000 mg | ORAL_TABLET | ORAL | Status: DC | PRN

## 2023-08-08 MED ORDER — ONDANSETRON HCL 4 MG/2ML IJ SOLN
INTRAMUSCULAR | Status: AC
  Filled 2023-08-08: qty 2

## 2023-08-08 MED ORDER — BUPIVACAINE-EPINEPHRINE (PF) 0.25% -1:200000 IJ SOLN
INTRAMUSCULAR | Status: DC | PRN
Start: 2023-08-08 — End: 2023-08-08
  Administered 2023-08-08: 30 mL via PERINEURAL

## 2023-08-08 MED ORDER — SODIUM CHLORIDE 0.9% FLUSH: 3.0000 mL | Freq: Two times a day (BID) | INTRAVENOUS | Status: DC

## 2023-08-08 MED ORDER — SILVER NITRATE-POT NITRATE 75-25 % EX MISC
CUTANEOUS | Status: AC
  Filled 2023-08-08: qty 10

## 2023-08-08 SURGICAL SUPPLY — 39 items
BAG COUNTER SPONGE SURGICOUNT (BAG) IMPLANT
BLADE HEX COATED 2.75 (ELECTRODE) ×2 IMPLANT
BLADE SURG 15 STRL LF DISP TIS (BLADE) ×2 IMPLANT
BRIEF MESH DISP LRG (UNDERPADS AND DIAPERS) ×2 IMPLANT
COVER BACK TABLE 60X90IN (DRAPES) ×2 IMPLANT
COVER MAYO STAND STRL (DRAPES) ×2 IMPLANT
COVER SURGICAL LIGHT HANDLE (MISCELLANEOUS) ×2 IMPLANT
DRAPE LAPAROTOMY T 98X78 PEDS (DRAPES) ×2 IMPLANT
DRAPE SHEET LG 3/4 BI-LAMINATE (DRAPES) IMPLANT
DRAPE UTILITY XL STRL (DRAPES) ×2 IMPLANT
DRSG VASELINE 3X18 (GAUZE/BANDAGES/DRESSINGS) IMPLANT
ELECT REM PT RETURN 15FT ADLT (MISCELLANEOUS) ×2 IMPLANT
GAUZE 4X4 16PLY ~~LOC~~+RFID DBL (SPONGE) IMPLANT
GAUZE PAD ABD 8X10 STRL (GAUZE/BANDAGES/DRESSINGS) ×2 IMPLANT
GAUZE SPONGE 4X4 12PLY STRL (GAUZE/BANDAGES/DRESSINGS) IMPLANT
GLOVE BIO SURGEON STRL SZ 6.5 (GLOVE) ×2 IMPLANT
GLOVE INDICATOR 6.5 STRL GRN (GLOVE) ×2 IMPLANT
GOWN STRL REUS W/ TWL LRG LVL3 (GOWN DISPOSABLE) ×2 IMPLANT
GOWN STRL REUS W/ TWL XL LVL3 (GOWN DISPOSABLE) ×4 IMPLANT
KIT BASIN OR (CUSTOM PROCEDURE TRAY) ×2 IMPLANT
KIT TURNOVER KIT A (KITS) IMPLANT
MANIFOLD NEPTUNE II (INSTRUMENTS) IMPLANT
NDL HYPO 25X1 1.5 SAFETY (NEEDLE) ×2 IMPLANT
NDL SAFETY ECLIPSE 18X1.5 (NEEDLE) IMPLANT
NEEDLE HYPO 25X1 1.5 SAFETY (NEEDLE) ×1 IMPLANT
PENCIL SMOKE EVACUATOR (MISCELLANEOUS) IMPLANT
PROTECTOR NERVE ULNAR (MISCELLANEOUS) IMPLANT
SPIKE FLUID TRANSFER (MISCELLANEOUS) ×2 IMPLANT
SPONGE SURGIFOAM ABS GEL 12-7 (HEMOSTASIS) IMPLANT
SUT CHROMIC 2 0 SH (SUTURE) IMPLANT
SUT CHROMIC 3 0 SH 27 (SUTURE) IMPLANT
SUT MON AB 3-0 SH27 (SUTURE) IMPLANT
SUT VIC AB 4-0 P-3 18XBRD (SUTURE) IMPLANT
SYR CONTROL 10ML LL (SYRINGE) ×2 IMPLANT
TOWEL OR 17X26 10 PK STRL BLUE (TOWEL DISPOSABLE) ×4 IMPLANT
TUBING CONNECTING 10 (TUBING) ×2 IMPLANT
VACUUM HOSE 7/8X10 W/ WAND (MISCELLANEOUS) ×2 IMPLANT
WATER STERILE IRR 500ML POUR (IV SOLUTION) ×2 IMPLANT
YANKAUER SUCT BULB TIP NO VENT (SUCTIONS) ×2 IMPLANT

## 2023-08-08 NOTE — Anesthesia Postprocedure Evaluation (Signed)
 Anesthesia Post Note  Patient: Darryl Lozano  Procedure(s) Performed: ABLATION, CONDYLOMA, USING LASER EXCISION, SKIN TAG EXAM UNDER ANESTHESIA     Patient location during evaluation: PACU Anesthesia Type: MAC Level of consciousness: awake Pain management: pain level controlled Vital Signs Assessment: post-procedure vital signs reviewed and stable Respiratory status: spontaneous breathing, nonlabored ventilation and respiratory function stable Cardiovascular status: stable and blood pressure returned to baseline Postop Assessment: no apparent nausea or vomiting Anesthetic complications: no   No notable events documented.  Last Vitals:  Vitals:   08/08/23 1200 08/08/23 1211  BP: 98/64 115/73  Pulse: (!) 49 (!) 57  Resp: (!) 22 18  Temp: 36.4 C 36.4 C  SpO2: 99% 98%    Last Pain:  Vitals:   08/08/23 1211  TempSrc:   PainSc: 0-No pain                 Linton Rump

## 2023-08-08 NOTE — Op Note (Signed)
 08/08/2023  11:07 AM  PATIENT:  Darryl Lozano  63 y.o. male  Patient Care Team: Georgianne Fick, MD as PCP - General (Internal Medicine)  PRE-OPERATIVE DIAGNOSIS:  ANAL WARTS, CLOSED COMEDOMES OF ANUS  POST-OPERATIVE DIAGNOSIS:  ANAL WARTS, CLOSED COMEDOMES OF ANUS  PROCEDURE:  LASER ABLATION, CONDYLOMA EXCISION SKIN TAG EXAM UNDER ANESTHESIA    Surgeon(s): Romie Levee, MD  ASSISTANT: none   ANESTHESIA:   local and MAC  EBL: 5ml  Total I/O In: -  Out: 5 [Blood:5]  SPECIMEN:  No Specimen  DISPOSITION OF SPECIMEN:  N/A  COUNTS:  YES  PLAN OF CARE: Discharge to home after PACU  PATIENT DISPOSITION:  PACU - hemodynamically stable.  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 MAC anesthesia was smoothly induced.  The patient was then prepped and draped in the usual sterile fashion. A surgical timeout was performed indicating the correct patient, procedure, positioning and preoperative antibioitics. SCDs were noted to be in place and functioning prior to the operation.   After this was completed, a sponge was soaked in 5% acetic acid was placed over the perianal region. This was allowed to soak for 2 minutes. The sponge was removed and the perianal region was evaluated.  There were 3 external lesions.  The internal anal canal was evaluated via anoscopy with a Hill-Ferguson anoscope.  There were no internal lesions noted. I expressed the smaller comedones and excised the largest one.  This was closed with interrupted 2-0 Chromic sutures.  Next the laser was brought onto the field.  The edges of the operative field were draped with wet towels.  Appropriate ventilation was obtained.  All staff were protected with small  particle masks and goggles safe for the laser.  The laser was then activated. All condylomatous lesions were ablated. Hemostasis was then achieved using electrocautery. A sterile dressing was applied over this. The patient was then awakened from anesthesia and sent to the postanesthesia care unit in stable condition. All counts were correct operating room staff.   Vanita Panda, MD  Colorectal and General Surgery [facility] Surgery

## 2023-08-08 NOTE — Discharge Instructions (Addendum)

## 2023-08-08 NOTE — Anesthesia Procedure Notes (Signed)
 Procedure Name: MAC Date/Time: 08/08/2023 10:29 AM  Performed by: Ludwig Lean, CRNAPre-anesthesia Checklist: Patient identified, Emergency Drugs available, Suction available and Patient being monitored Patient Re-evaluated:Patient Re-evaluated prior to induction Oxygen Delivery Method: Nasal cannula Placement Confirmation: positive ETCO2 and breath sounds checked- equal and bilateral Dental Injury: Teeth and Oropharynx as per pre-operative assessment

## 2023-08-08 NOTE — Transfer of Care (Signed)
 Immediate Anesthesia Transfer of Care Note  Patient: Darryl Lozano  Procedure(s) Performed: Procedure(s): ABLATION, CONDYLOMA, USING LASER (N/A) EXCISION, SKIN TAG (N/A) EXAM UNDER ANESTHESIA (N/A)  Patient Location: PACU  Anesthesia Type:MAC  Level of Consciousness: Patient easily awoken, comfortable, cooperative, following commands, responds to stimulation.   Airway & Oxygen Therapy: Patient spontaneously breathing, ventilating well, oxygen via simple oxygen mask.  Post-op Assessment: Report given to PACU RN, vital signs reviewed and stable, moving all extremities.   Post vital signs: Reviewed and stable.  Complications: No apparent anesthesia complications Last Vitals:  Vitals Value Taken Time  BP 94/57 08/08/23 1125  Temp    Pulse 59 08/08/23 1124  Resp 21 08/08/23 1124  SpO2 98 % 08/08/23 1124  Vitals shown include unfiled device data.  Last Pain:  Vitals:   08/08/23 0833  TempSrc:   PainSc: 0-No pain         Complications: No notable events documented.

## 2023-08-08 NOTE — Interval H&P Note (Signed)
 History and Physical Interval Note:  08/08/2023 8:19 AM  Darryl Lozano  has presented today for surgery, with the diagnosis of ANAL WARTS, CLOSED COMEDOMES OF ANUS.  The various methods of treatment have been discussed with the patient and family. After consideration of risks, benefits and other options for treatment, the patient has consented to  Procedure(s): ABLATION, CONDYLOMA, USING LASER (N/A) EXCISION, SKIN TAG (N/A) EXAM UNDER ANESTHESIA (N/A) as a surgical intervention.  The patient's history has been reviewed, patient examined, no change in status, stable for surgery.  I have reviewed the patient's chart and labs.  Questions were answered to the patient's satisfaction.     Vanita Panda, MD  Colorectal and General Surgery [facility] Surgery

## 2023-08-09 ENCOUNTER — Encounter (HOSPITAL_COMMUNITY): Payer: Self-pay | Admitting: General Surgery

## 2023-09-14 DIAGNOSIS — L700 Acne vulgaris: Secondary | ICD-10-CM | POA: Diagnosis not present

## 2023-10-12 DIAGNOSIS — L308 Other specified dermatitis: Secondary | ICD-10-CM | POA: Diagnosis not present

## 2023-11-01 DIAGNOSIS — L299 Pruritus, unspecified: Secondary | ICD-10-CM | POA: Diagnosis not present

## 2023-11-01 DIAGNOSIS — R21 Rash and other nonspecific skin eruption: Secondary | ICD-10-CM | POA: Diagnosis not present

## 2023-11-07 DIAGNOSIS — R21 Rash and other nonspecific skin eruption: Secondary | ICD-10-CM | POA: Diagnosis not present

## 2023-11-07 DIAGNOSIS — L299 Pruritus, unspecified: Secondary | ICD-10-CM | POA: Diagnosis not present

## 2023-11-14 DIAGNOSIS — R21 Rash and other nonspecific skin eruption: Secondary | ICD-10-CM | POA: Diagnosis not present

## 2023-11-14 DIAGNOSIS — L299 Pruritus, unspecified: Secondary | ICD-10-CM | POA: Diagnosis not present

## 2023-11-26 NOTE — Progress Notes (Unsigned)
 New Patient Note  RE: Darryl Lozano MRN: [id number] DOB: Feb 04, 1961 Date of Office Visit: 11/27/2023  Consult requested by: Verdia Lombard, MD Primary care provider: Verdia Lombard, MD  Chief Complaint: No chief complaint on file.  History of Present Illness: I had the pleasure of seeing Darryl Lozano for initial evaluation at the [facility] on 11/27/2023. He is a 63 y.o. male, who is referred here by Verdia Lombard, MD for the evaluation of ***.  Discussed the use of AI scribe software for clinical note transcription with the patient, who gave verbal consent to proceed.  History of Present Illness             ***  Assessment and Plan: Darryl Lozano is a 63 y.o. male with: ***  Assessment and Plan               No follow-ups on file.  No orders of the defined types were placed in this encounter.  Lab Orders  No laboratory test(s) ordered today    Other allergy screening: Asthma: {Blank single:19197::yes,no} Rhino conjunctivitis: {Blank single:19197::yes,no} Food allergy: {Blank single:19197::yes,no} Medication allergy: {Blank single:19197::yes,no} Hymenoptera allergy: {Blank single:19197::yes,no} Urticaria: {Blank single:19197::yes,no} Eczema:{Blank single:19197::yes,no} History of recurrent infections suggestive of immunodeficency: {Blank single:19197::yes,no}  Diagnostics: Spirometry:  Tracings reviewed. His effort: {Blank single:19197::Good reproducible efforts.,It was hard to get consistent efforts and there is a question as to whether this reflects a maximal maneuver.,Poor effort, data can not be interpreted.} FVC: ***L FEV1: ***L, ***% predicted FEV1/FVC ratio: ***% Interpretation: {Blank single:19197::Spirometry consistent with mild obstructive disease,Spirometry consistent with moderate obstructive disease,Spirometry consistent with severe obstructive disease,Spirometry  consistent with possible restrictive disease,Spirometry consistent with mixed obstructive and restrictive disease,Spirometry uninterpretable due to technique,Spirometry consistent with normal pattern,No overt abnormalities noted given today's efforts}.  Please see scanned spirometry results for details.  Skin Testing: {Blank single:19197::Select foods,Environmental allergy panel,Environmental allergy panel and select foods,Food allergy panel,None,Deferred due to recent antihistamines use}. *** Results discussed with patient/family.   Past Medical History: There are no active problems to display for this patient.  No past medical history on file. Past Surgical History: Past Surgical History:  Procedure Laterality Date  . EXCISION OF SKIN TAG N/A 08/08/2023   Procedure: EXCISION, SKIN TAG;  Surgeon: Debby Hila, MD;  Location: [facility] ORS;  Service: General;  Laterality: N/A;  . INGUINAL HERNIA REPAIR Right 06/06/1971  . LASER ABLATION CONDOLAMATA N/A 08/08/2023   Procedure: ABLATION, CONDYLOMA, USING LASER;  Surgeon: Debby Hila, MD;  Location: [facility] ORS;  Service: General;  Laterality: N/A;  . WISDOM TOOTH EXTRACTION     Medication List:  Current Outpatient Medications  Medication Sig Dispense Refill  . ibuprofen (ADVIL) 200 MG tablet Take 400 mg by mouth every 6 (six) hours as needed for moderate pain (pain score 4-6).    . Lidocaine , Anorectal, (RECTICARE) 5 % CREA Apply 1 Application topically 3 (three) times daily as needed. 28 g 1  . traMADol  (ULTRAM ) 50 MG tablet Take 1-2 tablets (50-100 mg total) by mouth every 6 (six) hours as needed. 30 tablet 0   No current facility-administered medications for this visit.   Allergies: No Known Allergies Social History: Social History   Socioeconomic History  . Marital status: Married    Spouse name: Not on file  . Number of children: Not on file  . Years of education: Not on file  . Highest education level: Not on  file  Occupational History  . Occupation: food and beverage  Tobacco Use  . Smoking status: Some Days    Types: Cigars  . Smokeless tobacco: Never  Vaping Use  . Vaping status: Never Used  Substance and Sexual Activity  . Alcohol use: Yes    Alcohol/week: 2.0 standard drinks of alcohol    Types: 2 Glasses of wine per week    Comment: 1-2 glasses of wine a week  . Drug use: No  . Sexual activity: Not on file  Other Topics Concern  . Not on file  Social History Narrative  . Not on file   Social Drivers of Health   Financial Resource Strain: Not on file  Food Insecurity: Not on file  Transportation Needs: Not on file  Physical Activity: Not on file  Stress: Not on file  Social Connections: Not on file   Lives in a ***. Smoking: *** Occupation: ***  Environmental History: Water  Damage/mildew in the house: {Blank single:19197::yes,no} Carpet in the family room: {Blank single:19197::yes,no} Carpet in the bedroom: {Blank single:19197::yes,no} Heating: {Blank single:19197::electric,gas,heat pump} Cooling: {Blank single:19197::central,window,heat pump} Pet: {Blank single:19197::yes ***,no}  Family History: Family History  Problem Relation Age of Onset  . Esophageal cancer Maternal Grandmother   . Colon cancer Neg Hx   . Stomach cancer Neg Hx    Problem                               Relation Asthma                                   *** Eczema                                *** Food allergy                          *** Allergic rhino conjunctivitis     ***  Review of Systems  Constitutional:  Negative for appetite change, chills, fever and unexpected weight change.  HENT:  Negative for congestion and rhinorrhea.   Eyes:  Negative for itching.  Respiratory:  Negative for cough, chest tightness, shortness of breath and wheezing.   Cardiovascular:  Negative for chest pain.  Gastrointestinal:  Negative for abdominal pain.  Genitourinary:   Negative for difficulty urinating.  Skin:  Negative for rash.  Neurological:  Negative for headaches.   Objective: There were no vitals taken for this visit. There is no height or weight on file to calculate BMI. Physical Exam Vitals and nursing note reviewed.  Constitutional:      Appearance: Normal appearance. He is well-developed.  HENT:     Head: Normocephalic and atraumatic.     Right Ear: Tympanic membrane and external ear normal.     Left Ear: Tympanic membrane and external ear normal.     Nose: Nose normal.     Mouth/Throat:     Mouth: Mucous membranes are moist.     Pharynx: Oropharynx is clear.   Eyes:     Conjunctiva/sclera: Conjunctivae normal.    Cardiovascular:     Rate and Rhythm: Normal rate and regular rhythm.     Heart sounds: Normal heart sounds. No murmur heard.    No friction rub. No gallop.  Pulmonary:     Effort: Pulmonary effort is normal.  Breath sounds: Normal breath sounds. No wheezing, rhonchi or rales.   Musculoskeletal:     Cervical back: Neck supple.   Skin:    General: Skin is warm.     Findings: No rash.   Neurological:     Mental Status: He is alert and oriented to person, place, and time.   Psychiatric:        Behavior: Behavior normal.  The plan was reviewed with the patient/family, and all questions/concerned were addressed.  It was my pleasure to see Darryl Lozano today and participate in his care. Please feel free to contact me with any questions or concerns.  Sincerely,  Orlan Cramp, DO Allergy & Immunology  [facility]  [place] office: [phone] [facility] office: [phone]

## 2023-11-27 ENCOUNTER — Encounter: Payer: Self-pay | Admitting: Allergy

## 2023-11-27 ENCOUNTER — Ambulatory Visit (INDEPENDENT_AMBULATORY_CARE_PROVIDER_SITE_OTHER): Payer: Self-pay | Admitting: Allergy

## 2023-11-27 VITALS — BP 100/78 | HR 67 | Temp 97.9°F | Resp 20 | Ht 71.26 in | Wt 157.5 lb

## 2023-11-27 DIAGNOSIS — R21 Rash and other nonspecific skin eruption: Secondary | ICD-10-CM

## 2023-11-27 NOTE — Patient Instructions (Addendum)
 Rash  Etiology unclear.  Unlikely to be related to environmental allergies or food allergies.   Keep track of rashes and take pictures. Write down what you had done during flares.   Monitor symptoms after eating shrimp. If no issues then okay to eat it.   May start zyrtec (cetirizine) 10mg  OR allegra (fexofenadine) 180mg  twice a day as needed.  If symptoms are not controlled or causes drowsiness let us  know. See below for proper skin care.   Follow up as needed.   Follow up with [facility].   Skin care recommendations  Bath time: Always use lukewarm water . AVOID very hot or cold water . Keep bathing time to 5-10 minutes. Do NOT use bubble bath. Use a mild soap and use just enough to wash the dirty areas. Do NOT scrub skin vigorously.  After bathing, pat dry your skin with a towel. Do NOT rub or scrub the skin.  Moisturizers and prescriptions:  ALWAYS apply moisturizers immediately after bathing (within 3 minutes). This helps to lock-in moisture. Use the moisturizer several times a day over the whole body. Good summer moisturizers include: Aveeno, CeraVe, Cetaphil. Good winter moisturizers include: Aquaphor, Vaseline, Cerave, Cetaphil, Eucerin, Vanicream. When using moisturizers along with medications, the moisturizer should be applied about one hour after applying the medication to prevent diluting effect of the medication or moisturize around where you applied the medications. When not using medications, the moisturizer can be continued twice daily as maintenance.  Laundry and clothing: Avoid laundry products with added color or perfumes. Use unscented hypo-allergenic laundry products such as Tide free, Cheer free & gentle, and All free and clear.  If the skin still seems dry or sensitive, you can try double-rinsing the clothes. Avoid tight or scratchy clothing such as wool. Do not use fabric softeners or dyer sheets.

## 2024-03-07 DIAGNOSIS — E781 Pure hyperglyceridemia: Secondary | ICD-10-CM | POA: Diagnosis not present

## 2024-03-07 DIAGNOSIS — Z125 Encounter for screening for malignant neoplasm of prostate: Secondary | ICD-10-CM | POA: Diagnosis not present

## 2024-03-24 ENCOUNTER — Other Ambulatory Visit (HOSPITAL_BASED_OUTPATIENT_CLINIC_OR_DEPARTMENT_OTHER): Payer: Self-pay | Admitting: Internal Medicine

## 2024-03-24 DIAGNOSIS — K644 Residual hemorrhoidal skin tags: Secondary | ICD-10-CM | POA: Diagnosis not present

## 2024-03-24 DIAGNOSIS — E781 Pure hyperglyceridemia: Secondary | ICD-10-CM

## 2024-03-24 DIAGNOSIS — Z0000 Encounter for general adult medical examination without abnormal findings: Secondary | ICD-10-CM | POA: Diagnosis not present

## 2024-03-24 DIAGNOSIS — Z23 Encounter for immunization: Secondary | ICD-10-CM | POA: Diagnosis not present

## 2024-03-24 DIAGNOSIS — Z125 Encounter for screening for malignant neoplasm of prostate: Secondary | ICD-10-CM | POA: Diagnosis not present

## 2024-04-09 ENCOUNTER — Ambulatory Visit (HOSPITAL_BASED_OUTPATIENT_CLINIC_OR_DEPARTMENT_OTHER)
Admission: RE | Admit: 2024-04-09 | Discharge: 2024-04-09 | Disposition: A | Discharge status: Home / Self Care | Payer: Self-pay | Source: Ambulatory Visit | Attending: Internal Medicine | Admitting: Internal Medicine

## 2024-04-09 DIAGNOSIS — E781 Pure hyperglyceridemia: Secondary | ICD-10-CM
